# Patient Record
Sex: Female | Born: 2000 | Race: White | Hispanic: No | Marital: Single | Smoking: Never smoker
Health system: Southern US, Community
[De-identification: ages and names within clinical notes are randomized; demographics above are authoritative.]

## PROBLEM LIST (undated history)

## (undated) HISTORY — PX: OTHER SURGICAL HISTORY: SHX169

---

## 2018-03-03 ENCOUNTER — Other Ambulatory Visit: Payer: Self-pay

## 2018-03-03 ENCOUNTER — Emergency Department: Payer: BLUE CROSS/BLUE SHIELD

## 2018-03-03 DIAGNOSIS — Y9389 Activity, other specified: Secondary | ICD-10-CM | POA: Diagnosis not present

## 2018-03-03 DIAGNOSIS — W01198A Fall on same level from slipping, tripping and stumbling with subsequent striking against other object, initial encounter: Secondary | ICD-10-CM | POA: Diagnosis not present

## 2018-03-03 DIAGNOSIS — S060X1A Concussion with loss of consciousness of 30 minutes or less, initial encounter: Secondary | ICD-10-CM | POA: Diagnosis not present

## 2018-03-03 DIAGNOSIS — Y929 Unspecified place or not applicable: Secondary | ICD-10-CM | POA: Diagnosis not present

## 2018-03-03 DIAGNOSIS — Y998 Other external cause status: Secondary | ICD-10-CM | POA: Diagnosis not present

## 2018-03-03 DIAGNOSIS — S0990XA Unspecified injury of head, initial encounter: Secondary | ICD-10-CM | POA: Diagnosis present
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
## 2018-03-04 ENCOUNTER — Encounter: Payer: Self-pay | Admitting: Emergency Medicine

## 2018-03-04 ENCOUNTER — Emergency Department
Admission: EM | Admit: 2018-03-04 | Discharge: 2018-03-04 | Disposition: A | Discharge status: Home / Self Care | Payer: BLUE CROSS/BLUE SHIELD | Attending: Emergency Medicine | Admitting: Emergency Medicine

## 2018-03-04 DIAGNOSIS — S060X1A Concussion with loss of consciousness of 30 minutes or less, initial encounter: Secondary | ICD-10-CM

## 2018-03-04 DIAGNOSIS — S0990XA Unspecified injury of head, initial encounter: Secondary | ICD-10-CM
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
## 2018-03-04 NOTE — Discharge Instructions (Addendum)
Please follow up with student health for a concussion re evaluation. Please return with any other concerns. Please treat headaches with tylenol and ibuprofen.

## 2018-03-04 NOTE — ED Notes (Signed)
Pt reports HA, dizziness, some recent memory loss, Nausea and Vomiting x 1 s/p fall and striking face against a tree root  Pt is A&O x4, NAD, pt with friends from [place], pt concerned for concussion

## 2019-02-18 IMAGING — CT CT HEAD W/O CM
3 series · 16 of 47 positions shown, 19 images · non-contrast
Comparison: None.

CLINICAL DATA: Posttraumatic headache after head injury and fall.
Positive loss of consciousness.

EXAM:
CT HEAD WITHOUT CONTRAST
TECHNIQUE: Contiguous axial images were obtained from the base of the skull
through the vertex without intravenous contrast.

[Series 3: head wo · axial · 0.43mm/px · z∈[+362,+487]mm · 10 of 30 slices shown, 13 images]
[im 3/30  brain]
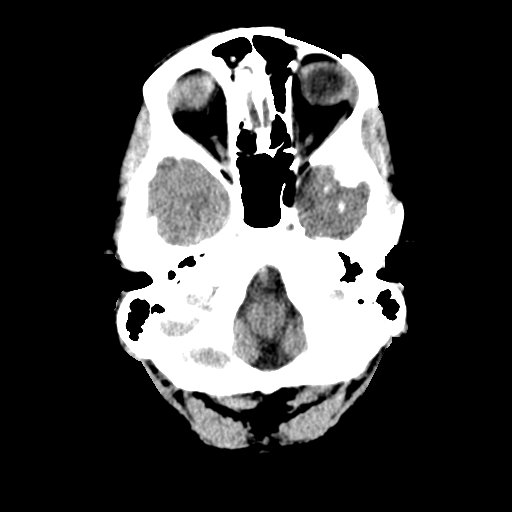
[im 3/30  bone]
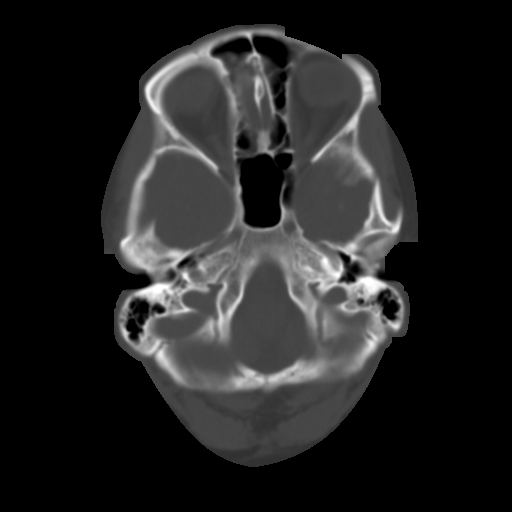
[im 6/30  brain]
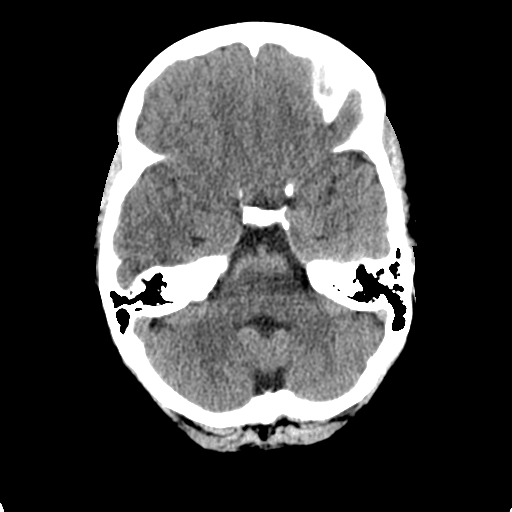
[im 9/30  brain]
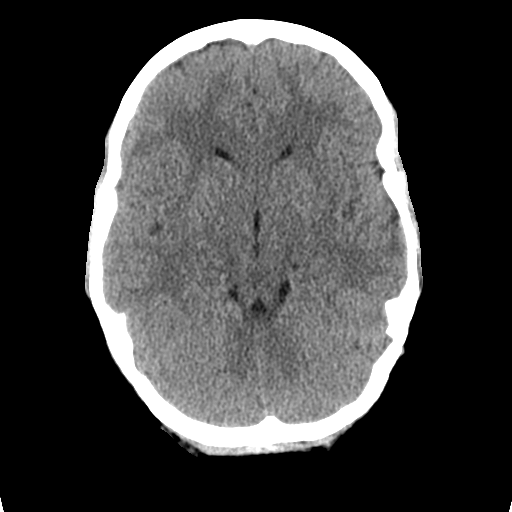
[im 11/30  brain]
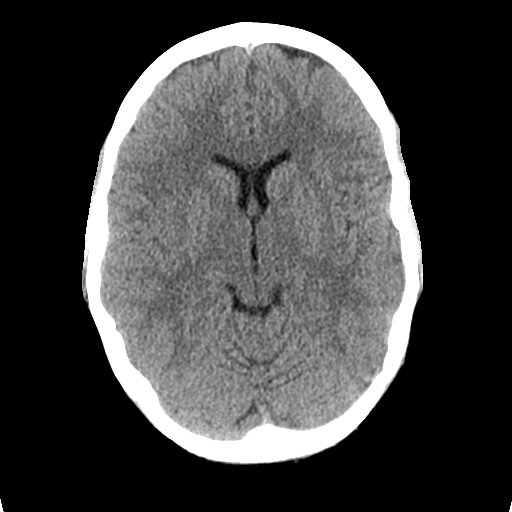
[im 14/30  brain]
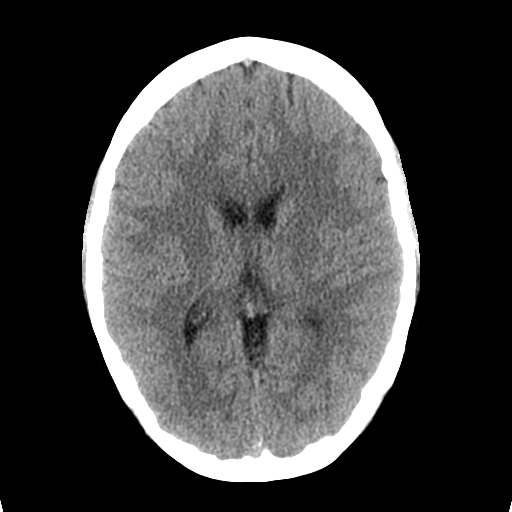
[im 14/30  bone]
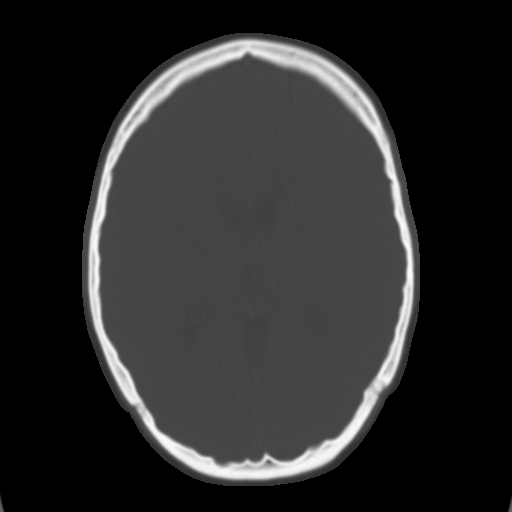
[im 17/30  brain]
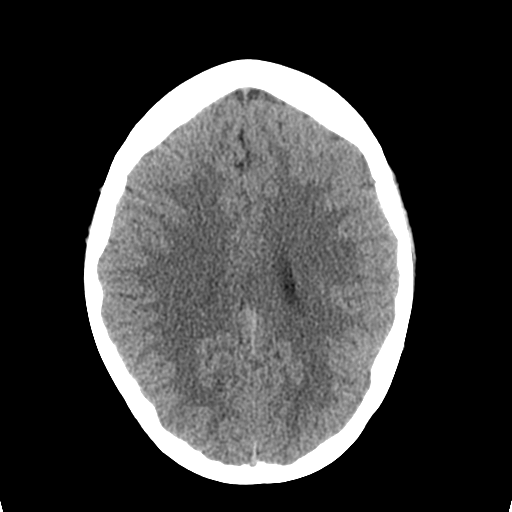
[im 20/30  brain]
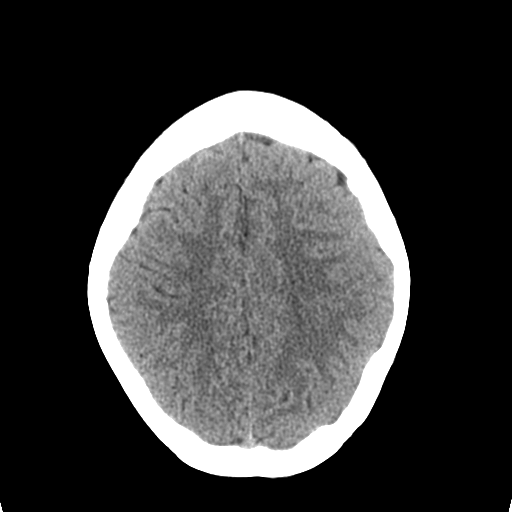
[im 23/30  brain]
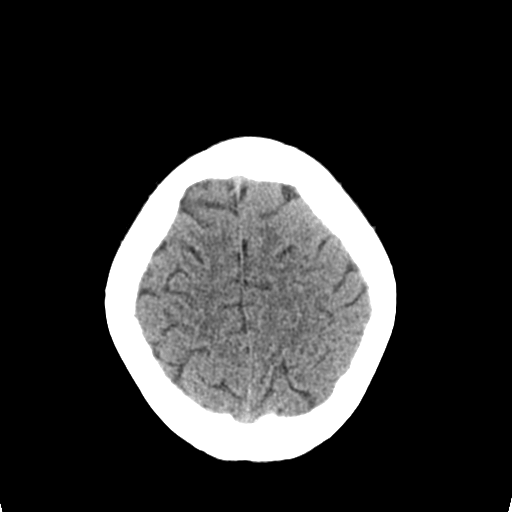
[im 25/30  brain]
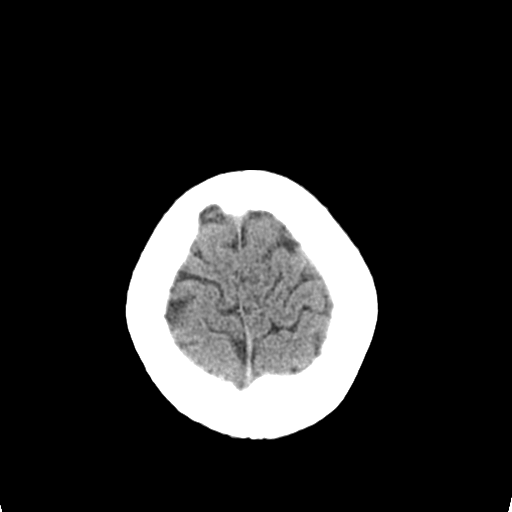
[im 25/30  bone]
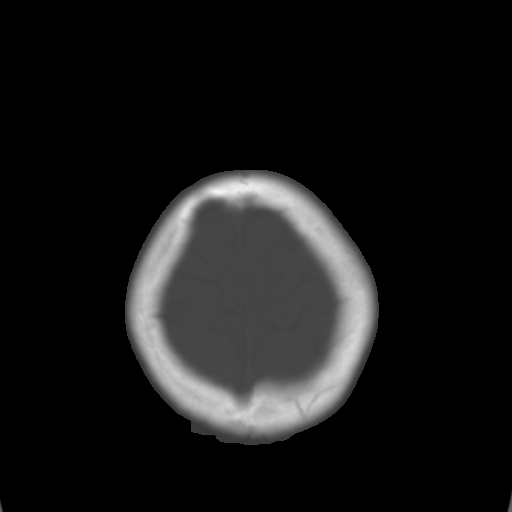
[im 28/30  brain]
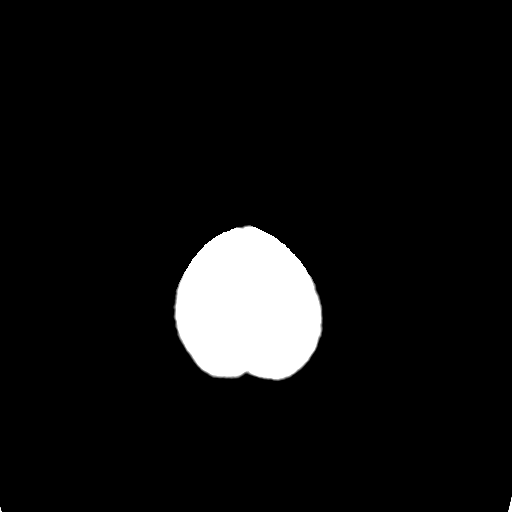

[Series 4: coronal soft tissue · coronal · 0.29mm/px · 3 of 67 slices shown]
[im 23/67  brain]
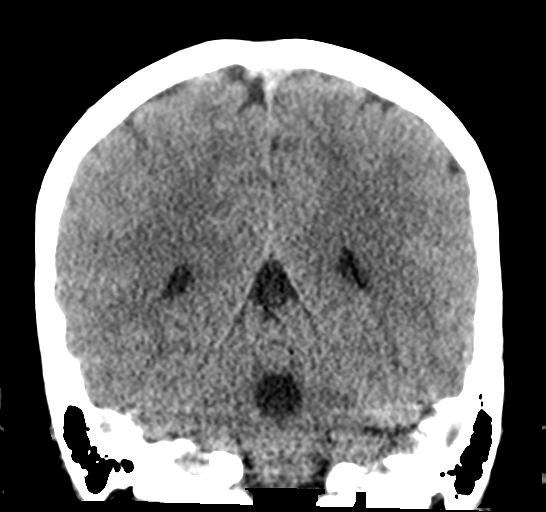
[im 30/67  brain]
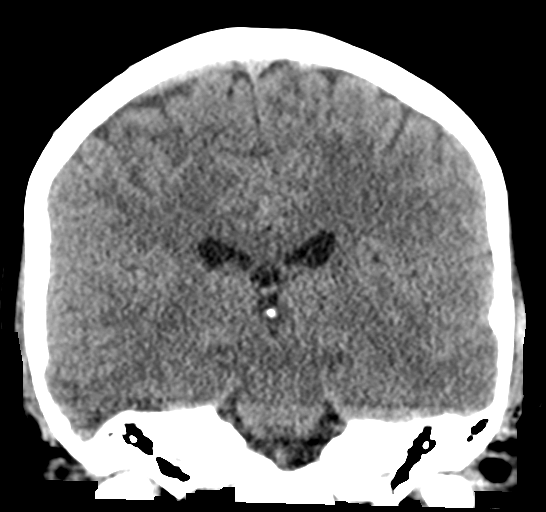
[im 37/67  brain]
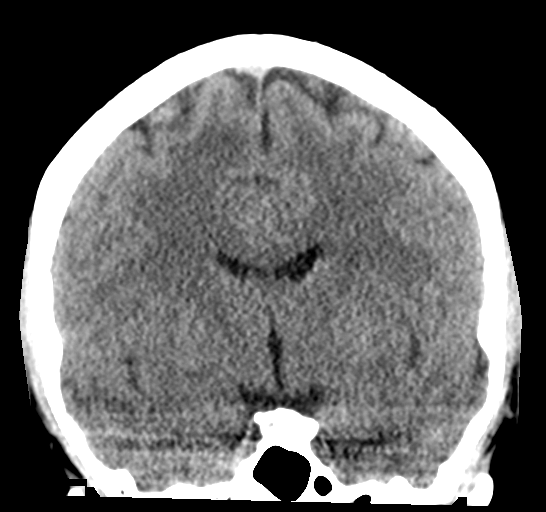

[Series 5: sagittal soft tissue · sagittal · 0.29mm/px · 3 of 54 slices shown]
[im 18/54  brain]
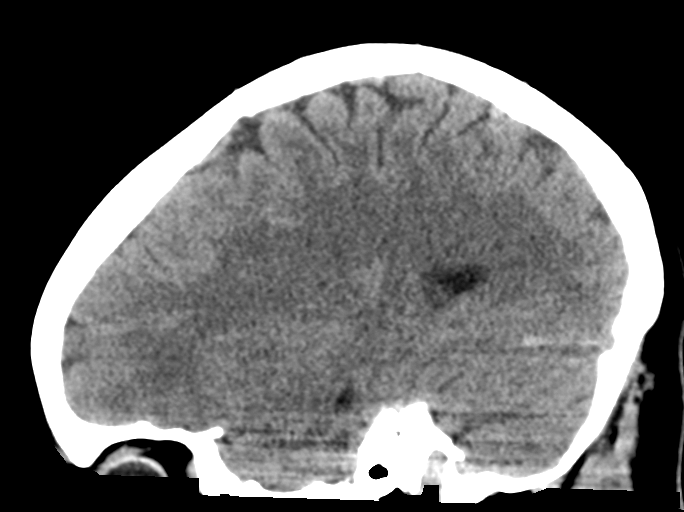
[im 27/54  brain]
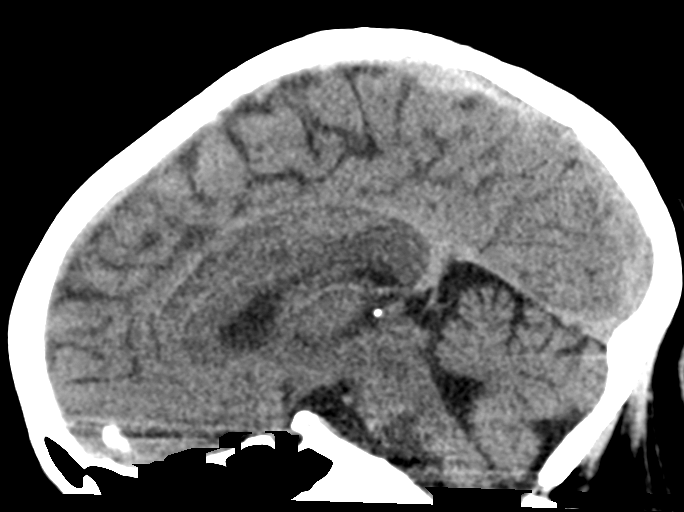
[im 36/54  brain]
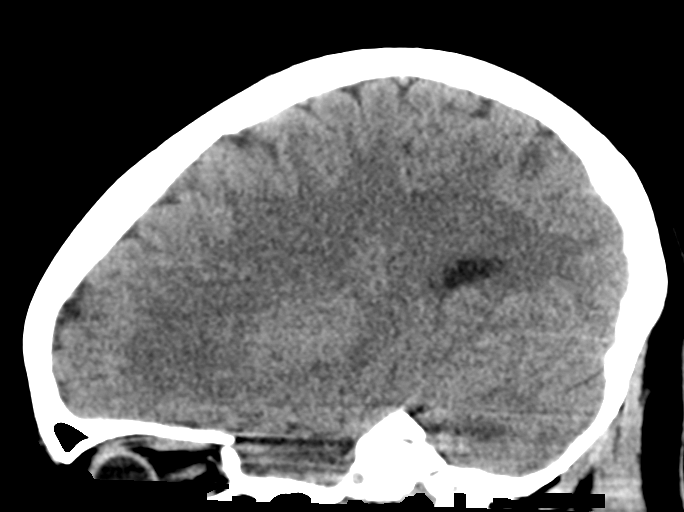

[16 of 47 positions shown; findings below may reference images not displayed]

FINDINGS: Brain: No evidence of acute infarction, hemorrhage, hydrocephalus,
extra-axial collection or mass lesion/mass effect.

Vascular: No hyperdense vessel or unexpected calcification.

Skull: Normal. Negative for fracture or focal lesion.

Sinuses/Orbits: No acute finding.

Other: None.
IMPRESSION: Normal head CT.

## 2020-01-19 ENCOUNTER — Ambulatory Visit: Payer: Self-pay | Attending: Internal Medicine

## 2020-01-19 DIAGNOSIS — Z23 Encounter for immunization: Secondary | ICD-10-CM

## 2020-01-19 NOTE — Progress Notes (Signed)
   Covid-19 Vaccination Clinic  Name:  Renee Cervantes    MRN: [id number] DOB: [birth year]  01/19/2020  [name] was observed post Covid-19 immunization for 15 minutes without incident. She was provided with Vaccine Information Sheet and instruction to access the V-Safe system.   [name] was instructed to call 911 with any severe reactions post vaccine: Marland Kitchen Difficulty breathing  . Swelling of face and throat  . A fast heartbeat  . A bad rash all over body  . Dizziness and weakness   Immunizations Administered    Name Date Dose VIS Date Route   Pfizer COVID-19 Vaccine 01/19/2020  1:02 PM 0.3 mL 10/10/2019 Intramuscular   Manufacturer: [employer], [employer]   Lot: [id number]   NDC: [address]-4

## 2020-02-09 ENCOUNTER — Ambulatory Visit: Payer: Self-pay | Attending: Internal Medicine

## 2020-02-09 DIAGNOSIS — Z23 Encounter for immunization: Secondary | ICD-10-CM

## 2020-02-09 NOTE — Progress Notes (Signed)
   Covid-19 Vaccination Clinic  Name:  Renee Cervantes    MRN: [id number] DOB: 07-26-2001  02/09/2020  Renee Cervantes was observed post Covid-19 immunization for 15 minutes without incident. She was provided with Vaccine Information Sheet and instruction to access the V-Safe system.   Renee Cervantes was instructed to call 911 with any severe reactions post vaccine: Marland Kitchen Difficulty breathing  . Swelling of face and throat  . A fast heartbeat  . A bad rash all over body  . Dizziness and weakness   Immunizations Administered    Name Date Dose VIS Date Route   Pfizer COVID-19 Vaccine 02/09/2020 12:45 PM 0.3 mL 10/10/2019 Intramuscular   Manufacturer: [employer], [employer]   Lot: [id number]   NDC: [address]-3
